# Patient Record
Sex: Female | Born: 1980 | Race: White | Hispanic: No | Marital: Married | Smoking: Former smoker
Health system: Southern US, Community
[De-identification: ages and names within clinical notes are randomized; demographics above are authoritative.]

## PROBLEM LIST (undated history)

## (undated) DIAGNOSIS — J45909 Unspecified asthma, uncomplicated: Secondary | ICD-10-CM

## (undated) DIAGNOSIS — K5090 Crohn's disease, unspecified, without complications: Secondary | ICD-10-CM

---

## 1999-01-27 ENCOUNTER — Ambulatory Visit (HOSPITAL_BASED_OUTPATIENT_CLINIC_OR_DEPARTMENT_OTHER): Admission: RE | Admit: 1999-01-27 | Discharge: 1999-01-27 | Payer: Self-pay | Admitting: *Deleted

## 2002-07-02 ENCOUNTER — Other Ambulatory Visit: Admission: RE | Admit: 2002-07-02 | Discharge: 2002-07-02 | Payer: Self-pay | Admitting: Obstetrics and Gynecology

## 2003-06-30 ENCOUNTER — Encounter: Admission: RE | Admit: 2003-06-30 | Discharge: 2003-06-30 | Payer: Self-pay | Admitting: Family Medicine

## 2003-07-22 ENCOUNTER — Other Ambulatory Visit: Admission: RE | Admit: 2003-07-22 | Discharge: 2003-07-22 | Payer: Self-pay | Admitting: Family Medicine

## 2004-05-26 ENCOUNTER — Encounter: Admission: RE | Admit: 2004-05-26 | Discharge: 2004-05-26 | Payer: Self-pay | Admitting: Family Medicine

## 2005-04-19 ENCOUNTER — Other Ambulatory Visit: Admission: RE | Admit: 2005-04-19 | Discharge: 2005-04-19 | Payer: Self-pay | Admitting: Obstetrics and Gynecology

## 2005-07-04 ENCOUNTER — Encounter: Admission: RE | Admit: 2005-07-04 | Discharge: 2005-07-04 | Payer: Self-pay | Admitting: Obstetrics and Gynecology

## 2007-03-04 ENCOUNTER — Emergency Department (HOSPITAL_COMMUNITY): Admission: EM | Admit: 2007-03-04 | Discharge: 2007-03-04 | Payer: Self-pay | Admitting: *Deleted

## 2007-06-06 ENCOUNTER — Inpatient Hospital Stay (HOSPITAL_COMMUNITY): Admission: EM | Admit: 2007-06-06 | Discharge: 2007-06-08 | Payer: Self-pay | Admitting: Emergency Medicine

## 2010-06-05 ENCOUNTER — Encounter: Payer: Self-pay | Admitting: Family Medicine

## 2010-09-28 NOTE — Discharge Summary (Signed)
NAME:  Sierra Brooks, Sierra Brooks            ACCOUNT NO.:  [id number]   MEDICAL RECORD NO.:  [id number]          PATIENT TYPE:  INP   LOCATION:  1333                         FACILITY:  [facility]   PHYSICIAN:  Corinna L. Lendell Caprice, MDDATE OF BIRTH:  [birth year]   DATE OF ADMISSION:  06/06/2007  DATE OF DISCHARGE:  06/08/2007                               DISCHARGE SUMMARY   DISCHARGE DIAGNOSES:  1. Acute bronchitis with asthma exacerbation.  Consider chronic      obstructive pulmonary disease.  2. Tobacco abuse, counseled against.  3. Anxiety and depression.   DISCHARGE MEDICATIONS:  1. Prednisone taper.  2. Azithromycin 250 mg p.o. daily for 2 more days.  3. Albuterol nebulizer has been arranged for home use as needed.  4. Continue Advair 250/50 one puff b.i.d.  5. Effexor XR 150 mg a day.  6. Xanax as needed as previous.  7. Nasacort as previous.  8. Ambien as needed as previous.  9. Tussionex 5 mL every 12 hours as needed for cough.   CONDITION:  Stable.   ACTIVITY:  Ad lib.   DIET:  Regular.   CONSULTATIONS:  None.   PROCEDURES:  None.   Follow up with Dr. Maia Plan al next week.   LABORATORY DATA:  White blood cell count was 12,000, otherwise normal  CBC.  On January 21, three liters of oxygen by nasal cannula revealed a  pH of 7.421, pCO2 30, pO2 57, bicarbonate 20, base deficit 3.5, oxygen  saturation 91%.  A D-dimer was 0.42.  Basic metabolic panel  unremarkable.   SPECIAL STUDIES/RADIOLOGY:  A chest x-ray on admission showed no focal  infiltrate, coarse central bronchial vascular markings similar to prior  and may represent reactive airway disease versus bronchitis.  Repeat  chest x-ray on January 21  showed new patchy areas of atelectasis but no  infiltrates, effusions or pneumothorax.   HISTORY AND HOSPITAL COURSE:  Sierra Brooks is a 30 year old white  female who presented to the emergency room with cough productive of  clear to white sputum.  She had subjective  fevers and chills.  She has a  history of asthma since childhood but is a smoker.  She denies a  diagnosis of COPD.  She had borrowed her friend's nebulizer machine,  felt no better, went to the [place] office, where she got nebulizers and  again felt no better and was therefore sent to the emergency room.  She  was treated with steroids and nebulizers but again failed to improve.  She had a pulse of 120,  otherwise normal vital signs.  She was coughing  but able to speak in full sentences.  She had good air movement.  Inspiratory and expiratory wheeze.  Chest x-ray showed no infiltrate.  She was placed on 23-hour observation and given azithromycin,  nebulizers, steroids; however, she worsened overnight and was changed to  an admission status.  Her steroids were accelerated.  By the time of  discharge she was feeling better.  She was able to ambulate the halls  with good oxygen saturations.  Her blood sugars remained fairly  unremarkable on steroids.  She was encouraged to quit smoking, and a  smoking cessation consult was ordered but as of yet is pending.      Corinna L. Lendell Caprice, MD  Electronically Signed     CLS/MEDQ  D:  06/08/2007  T:  06/08/2007  Job:  [id number]   cc:   Chales Salmon. Abigail Miyamoto, M.D.  Fax: [phone]

## 2010-09-28 NOTE — H&P (Signed)
NAME:  Sierra Brooks, Sierra Brooks            ACCOUNT NO.:  [id number]   MEDICAL RECORD NO.:  [id number]          PATIENT TYPE:  INP   LOCATION:  1333                         FACILITY:  [facility]   PHYSICIAN:  Corinna L. Lendell Caprice, MDDATE OF BIRTH:  1981/04/06   DATE OF ADMISSION:  06/06/2007  DATE OF DISCHARGE:                              HISTORY & PHYSICAL   CHIEF COMPLAINT:  Shortness of breath.   HISTORY OF PRESENT ILLNESS:  Ms. Sierra Brooks is a 30 year old white female  smoker with a history of asthma who presents with cough productive of  clear-white sputum.  She has had subjective fevers and chills, although  no documented fever.  She has been wheezing a lot.  She has a nebulizer  machine at home and continues to be short of breath.  She had several  nebulizer treatments at the [place] office and here in the emergency room.  She continues to feel short of breath and really feels no better.   PAST MEDICAL HISTORY:  1. Asthma.  Never been diagnosed with COPD, but she does smoke.  2. History of anxiety and depression.   MEDICATIONS:  1. Effexor XR 150 mg a day.  2. Xanax as needed.  3. Advair 250/50 1 puff b.i.d.  4. Xopenex as needed.  5. Nasacort.  6. Ambien as needed.   ALLERGIES:  PENICILLIN which causes tongue swelling.   SOCIAL HISTORY:  She smokes.  Denies drugs or heavy drinking.  She is a  Interior and spatial designer.   FOLLOW UP:  Noncontributory.   REVIEW OF SYSTEMS:  As above, otherwise negative.   PHYSICAL EXAMINATION:  VITAL SIGNS:  Temperature is 97.8, blood pressure  106/74, pulse 120, respiratory rate 20, oxygen saturation 94% on room  air.  GENERAL:  The patient is a white female who is coughing periodically,  able to speak in full sentences.  HEENT:  Normocephalic, atraumatic.  Pupils are equal, round, reactive to  light.  Sclerae nonicteric.  Moist mucous membranes.  Oropharynx is  without erythema or exudate.  NECK:  Supple.  No lymphadenopathy.  LUNGS:  She has good air  movement.  She has inspiratory and expiratory  wheezes, no rhonchi.  CARDIOVASCULAR:  Fast, regular, no murmurs, gallops or rubs.  ABDOMEN:  Soft, nontender, nondistended.  GU/RECTAL:  Deferred.  EXTREMITIES:  No clubbing, cyanosis or edema.  SKIN:  No rash.  PSYCHIATRIC:  Normal affect.   LABORATORY DATA:  White blood cell count is 12,000 with 70% neutrophils,  13% lymphocytes, 8% monocytes, 9% eosinophils, D-dimer 0.42.  Basic  metabolic panel unremarkable.   DIAGNOSTICS:  Chest x-ray shows no infiltrate, coarse central bronchial  vascular markings.   ASSESSMENT/PLAN:  1. Acute bronchitis with asthma exacerbation:  The patient will be      placed on 23-hour observation.  She will get oxygen nebulizers,      prednisone and azithromycin.  2. Anxiety:  Continue outpatient medications.  3. Tobacco abuse:  Counseled against and will get a smoking cessation      consult.      Corinna L. Lendell Caprice, MD  Electronically Signed     CLS/MEDQ  D:  06/07/2007  T:  06/07/2007  Job:  [id number]   cc:   Chales Salmon. Abigail Miyamoto, M.D.  Fax: [phone]

## 2011-02-04 LAB — CBC
MCHC: 34.1
MCV: 88.3
Platelets: 365
WBC: 12.8 — ABNORMAL HIGH

## 2011-02-04 LAB — BASIC METABOLIC PANEL
BUN: 5 — ABNORMAL LOW
Chloride: 108
Creatinine, Ser: 0.88

## 2011-02-04 LAB — BLOOD GAS, ARTERIAL
Acid-base deficit: 3.5 — ABNORMAL HIGH
Drawn by: 232811
O2 Content: 3
O2 Saturation: 90.8

## 2011-02-04 LAB — DIFFERENTIAL
Basophils Relative: 1
Eosinophils Absolute: 1.1 — ABNORMAL HIGH
Lymphs Abs: 1.7
Neutrophils Relative %: 70

## 2011-02-04 LAB — D-DIMER, QUANTITATIVE: D-Dimer, Quant: 0.42

## 2013-04-10 ENCOUNTER — Ambulatory Visit
Admission: RE | Admit: 2013-04-10 | Discharge: 2013-04-10 | Disposition: A | Discharge status: Home / Self Care | Payer: BC Managed Care – PPO | Source: Ambulatory Visit | Attending: Gastroenterology | Admitting: Gastroenterology

## 2013-04-10 ENCOUNTER — Other Ambulatory Visit: Payer: Self-pay | Admitting: Gastroenterology

## 2013-04-10 DIAGNOSIS — R1084 Generalized abdominal pain: Secondary | ICD-10-CM

## 2013-04-10 MED ORDER — IOHEXOL 300 MG/ML  SOLN: 100.0000 mL | Freq: Once | INTRAMUSCULAR | Status: DC | PRN

## 2013-04-10 MED ORDER — IOHEXOL 300 MG/ML  SOLN
30.0000 mL | Freq: Once | INTRAMUSCULAR | Status: AC | PRN
  Administered 2013-04-10: 30 mL via ORAL

## 2013-04-10 MED ORDER — IOHEXOL 300 MG/ML  SOLN
125.0000 mL | Freq: Once | INTRAMUSCULAR | Status: AC | PRN
  Administered 2013-04-10: 125 mL via INTRAVENOUS

## 2013-04-14 ENCOUNTER — Emergency Department (HOSPITAL_COMMUNITY)
Admission: EM | Admit: 2013-04-14 | Discharge: 2013-04-14 | Disposition: A | Discharge status: Home / Self Care | Payer: BC Managed Care – PPO | Attending: Emergency Medicine | Admitting: Emergency Medicine

## 2013-04-14 ENCOUNTER — Encounter (HOSPITAL_COMMUNITY): Payer: Self-pay | Admitting: Emergency Medicine

## 2013-04-14 DIAGNOSIS — R509 Fever, unspecified: Secondary | ICD-10-CM | POA: Insufficient documentation

## 2013-04-14 DIAGNOSIS — E876 Hypokalemia: Secondary | ICD-10-CM | POA: Insufficient documentation

## 2013-04-14 DIAGNOSIS — Z880 Allergy status to penicillin: Secondary | ICD-10-CM | POA: Insufficient documentation

## 2013-04-14 DIAGNOSIS — R109 Unspecified abdominal pain: Secondary | ICD-10-CM

## 2013-04-14 DIAGNOSIS — R1032 Left lower quadrant pain: Secondary | ICD-10-CM | POA: Insufficient documentation

## 2013-04-14 DIAGNOSIS — F17200 Nicotine dependence, unspecified, uncomplicated: Secondary | ICD-10-CM | POA: Insufficient documentation

## 2013-04-14 DIAGNOSIS — N83209 Unspecified ovarian cyst, unspecified side: Secondary | ICD-10-CM | POA: Insufficient documentation

## 2013-04-14 DIAGNOSIS — J45909 Unspecified asthma, uncomplicated: Secondary | ICD-10-CM | POA: Insufficient documentation

## 2013-04-14 DIAGNOSIS — Z3202 Encounter for pregnancy test, result negative: Secondary | ICD-10-CM | POA: Insufficient documentation

## 2013-04-14 DIAGNOSIS — K5090 Crohn's disease, unspecified, without complications: Secondary | ICD-10-CM | POA: Insufficient documentation

## 2013-04-14 DIAGNOSIS — R112 Nausea with vomiting, unspecified: Secondary | ICD-10-CM | POA: Insufficient documentation

## 2013-04-14 DIAGNOSIS — Z79899 Other long term (current) drug therapy: Secondary | ICD-10-CM | POA: Insufficient documentation

## 2013-04-14 HISTORY — DX: Crohn's disease, unspecified, without complications: K50.90

## 2013-04-14 HISTORY — DX: Unspecified asthma, uncomplicated: J45.909

## 2013-04-14 LAB — WET PREP, GENITAL
Clue Cells Wet Prep HPF POC: NONE SEEN
Trich, Wet Prep: NONE SEEN
Yeast Wet Prep HPF POC: NONE SEEN

## 2013-04-14 LAB — URINALYSIS, ROUTINE W REFLEX MICROSCOPIC
Glucose, UA: NEGATIVE mg/dL
Ketones, ur: 40 mg/dL — AB
Specific Gravity, Urine: 1.019 (ref 1.005–1.030)
pH: 6 (ref 5.0–8.0)

## 2013-04-14 LAB — COMPREHENSIVE METABOLIC PANEL
Albumin: 3.2 g/dL — ABNORMAL LOW (ref 3.5–5.2)
Alkaline Phosphatase: 90 U/L (ref 39–117)
BUN: 6 mg/dL (ref 6–23)
Calcium: 9.3 mg/dL (ref 8.4–10.5)
Potassium: 2.9 mEq/L — ABNORMAL LOW (ref 3.5–5.1)
Sodium: 132 mEq/L — ABNORMAL LOW (ref 135–145)
Total Protein: 7 g/dL (ref 6.0–8.3)

## 2013-04-14 LAB — CBC WITH DIFFERENTIAL/PLATELET
Basophils Relative: 0 % (ref 0–1)
Eosinophils Absolute: 0.2 10*3/uL (ref 0.0–0.7)
MCH: 29.3 pg (ref 26.0–34.0)
MCHC: 34.7 g/dL (ref 30.0–36.0)
Monocytes Relative: 10 % (ref 3–12)
Neutrophils Relative %: 79 % — ABNORMAL HIGH (ref 43–77)
Platelets: 364 10*3/uL (ref 150–400)

## 2013-04-14 LAB — URINE MICROSCOPIC-ADD ON

## 2013-04-14 LAB — POCT PREGNANCY, URINE: Preg Test, Ur: NEGATIVE

## 2013-04-14 MED ORDER — ONDANSETRON 4 MG PO TBDP
8.0000 mg | ORAL_TABLET | Freq: Once | ORAL | Status: AC
  Administered 2013-04-14: 8 mg via ORAL
  Filled 2013-04-14: qty 2

## 2013-04-14 MED ORDER — ONDANSETRON HCL 4 MG/2ML IJ SOLN
4.0000 mg | Freq: Once | INTRAMUSCULAR | Status: AC
  Administered 2013-04-14: 4 mg via INTRAVENOUS
  Filled 2013-04-14: qty 2

## 2013-04-14 MED ORDER — HYDROMORPHONE HCL PF 1 MG/ML IJ SOLN
1.0000 mg | Freq: Once | INTRAMUSCULAR | Status: AC
  Administered 2013-04-14: 1 mg via INTRAVENOUS
  Filled 2013-04-14: qty 1

## 2013-04-14 MED ORDER — HYDROCODONE-ACETAMINOPHEN 5-325 MG PO TABS: 2.0000 | ORAL_TABLET | ORAL | Status: AC | PRN

## 2013-04-14 MED ORDER — ONDANSETRON HCL 4 MG PO TABS: 4.0000 mg | ORAL_TABLET | Freq: Four times a day (QID) | ORAL | Status: AC

## 2013-04-14 MED ORDER — POTASSIUM CHLORIDE CRYS ER 20 MEQ PO TBCR
40.0000 meq | EXTENDED_RELEASE_TABLET | Freq: Once | ORAL | Status: AC
  Administered 2013-04-14: 40 meq via ORAL
  Filled 2013-04-14: qty 2

## 2013-04-14 NOTE — ED Notes (Signed)
Pt states she had a CT abdomen on Wed that showed she had a ruptured L ovarian cyst.  Pt has been vomiting since then.  Initially her vomiting stopped when she started taking oxycodone, but as the pain became greater, she began vomiting again (pain was not relieved by tramadol).  Pt states she also began spotting today and she is not due for her period for 1.5 weeks.  She was told by her GI doc to follow up with her OBGYN, but they are not open this weekend.

## 2013-04-14 NOTE — ED Provider Notes (Signed)
CSN: [id number]     Arrival date & time 04/14/13  1556 History   First MD Initiated Contact with Patient 04/14/13 1857     Chief Complaint  Patient presents with  . Abdominal Pain  . Emesis   (Consider location/radiation/quality/duration/timing/severity/associated sxs/prior Treatment) HPI  32 year old female history of Crohn's presents for evaluation of abdominal pain. Patient reports gradual onset of constant sharp crampy pain to the left side abdomen ongoing for about a week. Pain has gotten progressively worse, radiating across her abdomen. Pain is worsened with movement and improves when she lays flat. Pain is associated with nausea and vomiting. She vomits 2 or 3 times per day. Vomitus is nonbloody nonbilious. She has loose stools this for the past several months and was recently diagnosed with Crohn's disease 6 months ago. She does endorse subjective fever, and persistent nausea. Denies blood in the stool. She has been evaluated for her symptom 3 days ago. Abdominal CT scan shows evidence of a rupture ovarian cyst but no evidence of appendicitis or diverticulitis. No obvious Crohn's flare on CT scan. Patient was prescribed trauma at all. She tries taking the medication but it did not help. She also tries taking her home medication, Percocet with minimal relief however today it did not help. She also report having mild vaginal spotting this a.m. States this is early for her menstruation. Report mild vaginal discharge without any vaginal discomfort.   Past Medical History  Diagnosis Date  . Crohn's disease   . Asthma    History reviewed. No pertinent past surgical history. No family history on file. History  Substance Use Topics  . Smoking status: Current Every Day Smoker -- 0.50 packs/day    Types: Cigarettes  . Smokeless tobacco: Not on file  . Alcohol Use: No   OB History   Grav Para Term Preterm Abortions TAB SAB Ect Mult Living                 Review of Systems  All other  systems reviewed and are negative.    Allergies  Gluten meal and Penicillins  Home Medications   Current Outpatient Rx  Name  Route  Sig  Dispense  Refill  . albuterol (PROVENTIL HFA;VENTOLIN HFA) 108 (90 BASE) MCG/ACT inhaler   Inhalation   Inhale 1 puff into the lungs every 6 (six) hours as needed for wheezing or shortness of breath.         . azaTHIOprine (IMURAN) 50 MG tablet   Oral   Take 50 mg by mouth daily.         . budesonide-formoterol (SYMBICORT) 160-4.5 MCG/ACT inhaler   Inhalation   Inhale 1 puff into the lungs as needed (for wheezing and cough).         . esomeprazole (NEXIUM) 40 MG capsule   Oral   Take 40 mg by mouth daily.          Marland Kitchen oxyCODONE-acetaminophen (PERCOCET/ROXICET) 5-325 MG per tablet   Oral   Take 2 tablets by mouth every 8 (eight) hours as needed for severe pain.          BP 116/64  Pulse 95  Temp(Src) 100 F (37.8 C) (Oral)  Resp 16  Wt 213 lb 14.4 oz (97.024 kg)  SpO2 98%  LMP 03/27/2013 Physical Exam  Nursing note and vitals reviewed. Constitutional: She appears well-developed and well-nourished. No distress.  HENT:  Head: Normocephalic and atraumatic.  Eyes: Conjunctivae are normal.  Neck: Normal range of motion. Neck  supple.  Cardiovascular: Normal rate and regular rhythm.   Pulmonary/Chest: Effort normal and breath sounds normal. She exhibits no tenderness.  Abdominal: Soft. There is tenderness (left lower quadrant tenderness on palpation without guarding or rebound tenderness.). There is no rebound and no guarding.  Genitourinary: Uterus normal. There is no rash or lesion on the right labia. There is no rash or lesion on the left labia. Cervix exhibits no motion tenderness and no discharge. Right adnexum displays no mass and no tenderness. Left adnexum displays no mass and no tenderness. There is bleeding around the vagina. No erythema or tenderness around the vagina. No vaginal discharge found.  Chaperone  present   Dark blood noted in vaginal vault.  Cervical os is closed.    Lymphadenopathy:       Right: No inguinal adenopathy present.       Left: No inguinal adenopathy present.    ED Course  Procedures (including critical care time)  9:28 PM Hx of Crohns, and ruptured ovarian cyst.  Pelvic exam today without evidence suggestive of PID.  Recent CT without acute finding.  Preg negative, wet prep result unremarkable.  UA without evidence of UTI.  Pt has elevated WBC 15.7, could be due to recent steroid use.  K+ 2.9, supplementation given.  No active chest pain or SOB concerning for ACS or PE.  sxs could also be viral in etiology.  9:35 PM Pain is well controlled.  I recommend continue with pain management at home.  Follow up with PCP.  Take antinausea medication as needed.  Return if sxs worsen.  Pt able to tolerates PO at this time.    Labs Review Labs Reviewed  CBC WITH DIFFERENTIAL - Abnormal; Notable for the following:    WBC 15.7 (*)    Neutrophils Relative % 79 (*)    Neutro Abs 12.4 (*)    Lymphocytes Relative 10 (*)    Monocytes Absolute 1.6 (*)    All other components within normal limits  COMPREHENSIVE METABOLIC PANEL - Abnormal; Notable for the following:    Sodium 132 (*)    Potassium 2.9 (*)    Chloride 94 (*)    Albumin 3.2 (*)    All other components within normal limits  URINALYSIS, ROUTINE W REFLEX MICROSCOPIC - Abnormal; Notable for the following:    Bilirubin Urine SMALL (*)    Ketones, ur 40 (*)    Leukocytes, UA TRACE (*)    All other components within normal limits  URINE MICROSCOPIC-ADD ON  POCT PREGNANCY, URINE   Imaging Review No results found.  Show images for CT Abdomen Pelvis W Contrast         Study Result    CLINICAL DATA: Diffuse left-sided abdominal pain for 3 days, recent  diagnosis of Crohn's disease and gluten allergy  EXAM:  CT ABDOMEN AND PELVIS WITH CONTRAST  TECHNIQUE:  Multidetector CT imaging of the abdomen and pelvis was  performed  using the standard protocol following bolus administration of  intravenous contrast.  CONTRAST: 30mL OMNIPAQUE IOHEXOL 300 MG/ML SOLN, OMNIPAQUE  IOHEXOL 300 MG/ML SOLN  COMPARISON: None.  FINDINGS:  The lung bases are clear. The liver enhances with no focal  abnormality and no ductal dilatation is seen. No calcified  gallstones are noted. The pancreas is normal in size and the  pancreatic duct is not dilated. The adrenal glands and spleen are  unremarkable. The stomach is not well distended. The kidneys enhance  with no calculus or mass  and no hydronephrosis is seen. The  abdominal aorta is normal in caliber. No adenopathy is seen.  There are several slightly prominent nodes within the right lower  quadrant, with the largest of 8 mm in short axis diameter. The  terminal ileum is moderately well visualized and there is only  minimal prominence of the mucosa of the terminal ileum. No definite  evidence of Crohn's disease is identified by CT. These slightly  prominent lymph nodes in the right lower quadrant could represent  mesenteric adenitis. The appendix is not well seen but no  abnormality is evident.  The urinary bladder is decompressed. The uterus is normal in size. A  probable collapsing cyst is noted in the left adnexa. No free fluid  is noted within the pelvis. The lumbar vertebrae are in normal  alignment.  IMPRESSION:  1. No definite evidence of active Crohn's disease is seen. There may  be very minimal prominence of the mucosa of the terminal ileum.  2. Several slightly prominent lymph nodes are present within the  right lower quadrant. This is nonspecific and can be seen with  Crohn's disease, but also could indicate mesenteric adenitis.  3. Collapsing left ovarian cyst.  Electronically Signed  By: Dwyane Dee M.D.  On: 04/10/2013 12:17     EKG Interpretation   None       MDM   1. Abdominal pain   2. Ovarian cyst    BP 101/72  Pulse 99   Temp(Src) 100 F (37.8 C) (Oral)  Resp 16  Wt 213 lb 14.4 oz (97.024 kg)  SpO2 93%  LMP 03/27/2013  I have reviewed nursing notes and vital signs. I personally reviewed the imaging tests through PACS system  I reviewed available ER/hospitalization records thought the EMR    Sierra Brooks, [place] 04/14/13 2140

## 2013-04-15 LAB — GC/CHLAMYDIA PROBE AMP: GC Probe RNA: NEGATIVE

## 2013-04-15 NOTE — ED Provider Notes (Signed)
Medical screening examination/treatment/procedure(s) were performed by non-physician practitioner and as supervising physician I was immediately available for consultation/collaboration.  EKG Interpretation   None        Jakirah Zaun R. Webster Patrone, MD 04/15/13 0001 

## 2014-04-29 IMAGING — CT CT ABD-PELV W/ CM
2 of 4 series · 16 of 46 positions shown, 18 images · IV contrast (omnipaque)
Comparison: None.

CLINICAL DATA: Diffuse left-sided abdominal pain for 3 days, recent
diagnosis of Crohn's disease and gluten allergy

EXAM:
CT ABDOMEN AND PELVIS WITH CONTRAST
TECHNIQUE: Multidetector CT imaging of the abdomen and pelvis was performed
using the standard protocol following bolus administration of
intravenous contrast.
CONTRAST:  30mL OMNIPAQUE IOHEXOL 300 MG/ML SOLN, 125mL OMNIPAQUE
IOHEXOL 300 MG/ML SOLN

[Series 2: abd/pelvis with · axial · 0.96mm/px · z∈[-436,+4]mm · 13 of 96 slices shown, 15 images]
[im 4/96  soft-tissue]
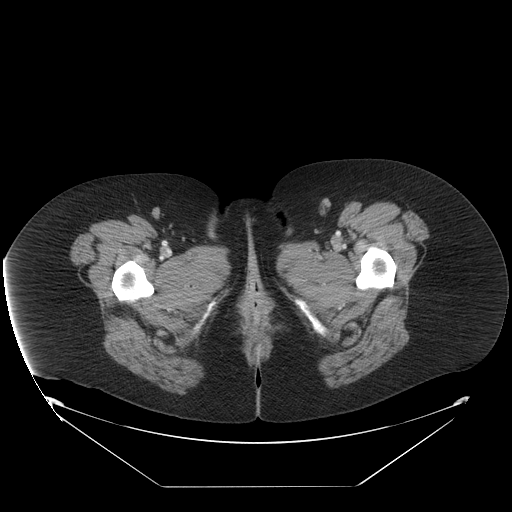
[im 4/96  bone]
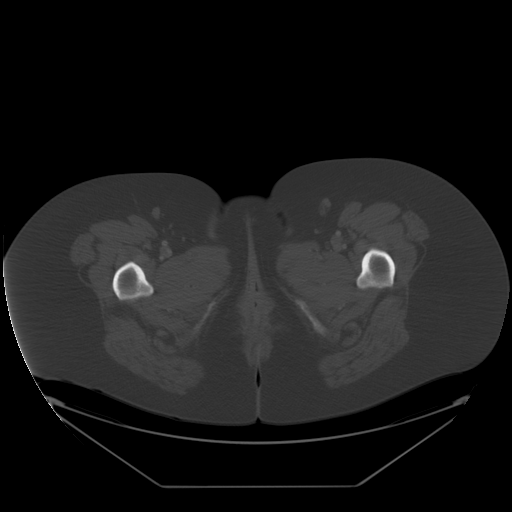
[im 12/96  soft-tissue]
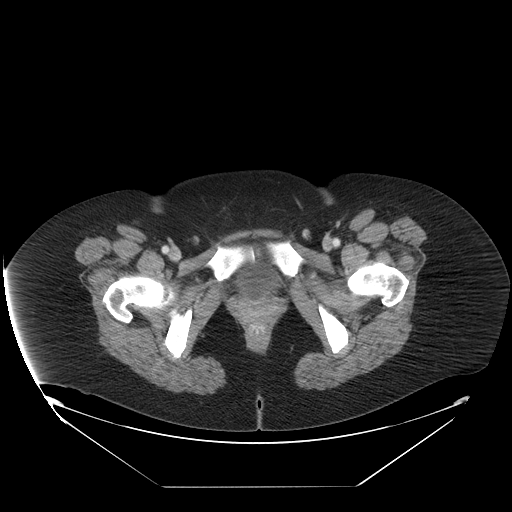
[im 20/96  soft-tissue]
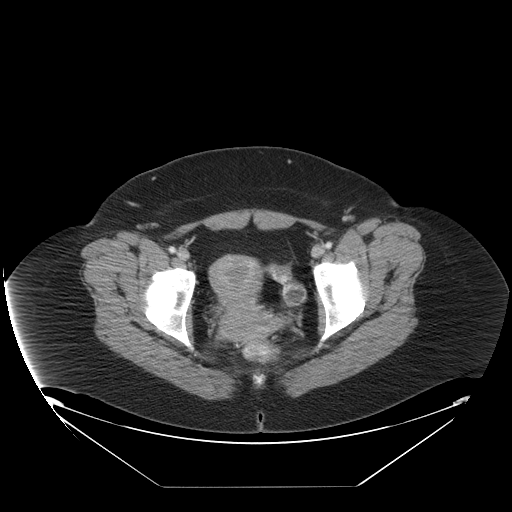
[im 27/96  soft-tissue]
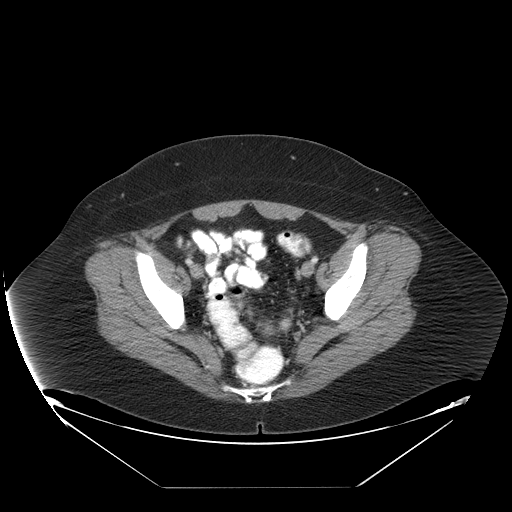
[im 35/96  soft-tissue]
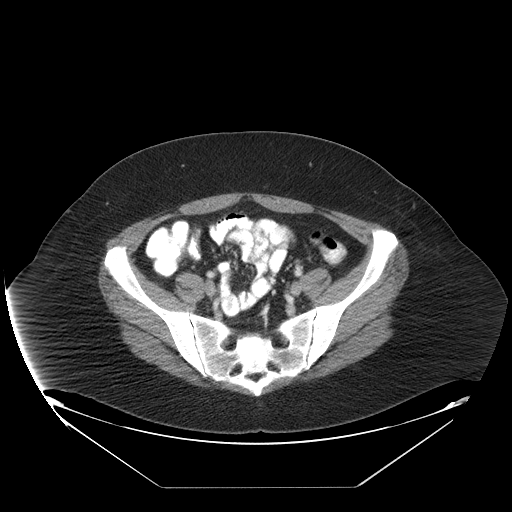
[im 42/96  soft-tissue]
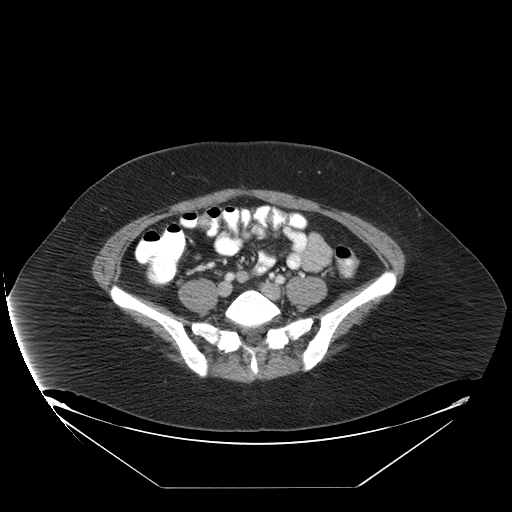
[im 50/96  soft-tissue]
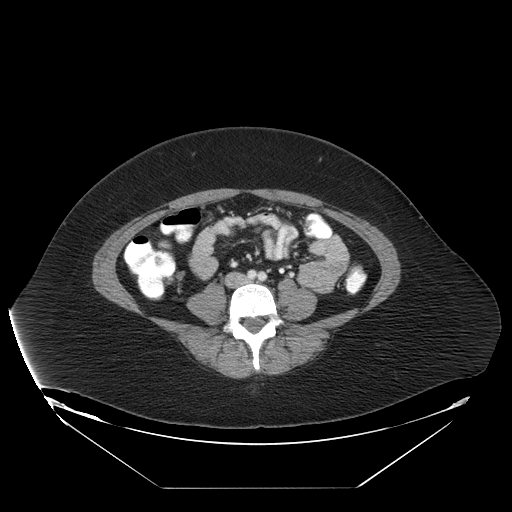
[im 54/96  soft-tissue]
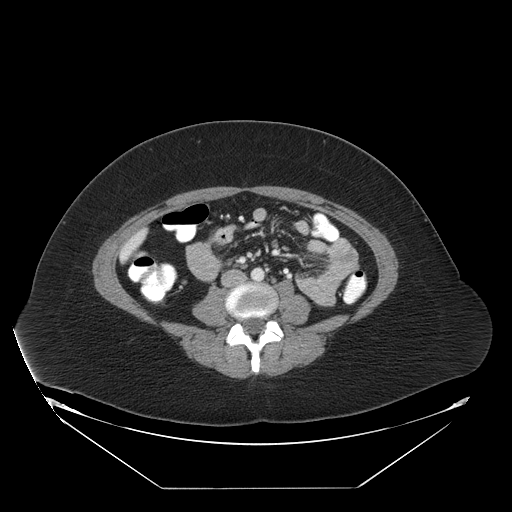
[im 61/96  soft-tissue]
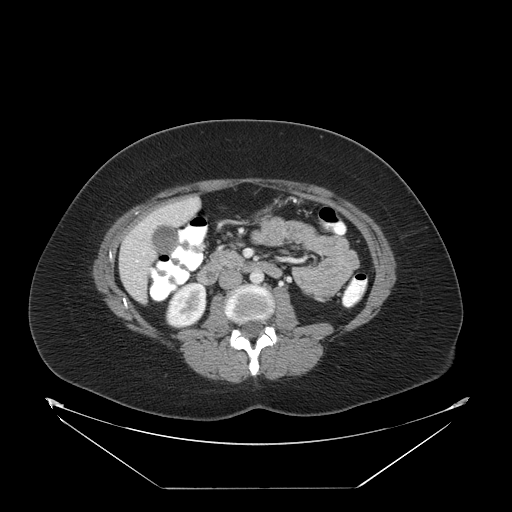
[im 61/96  bone]
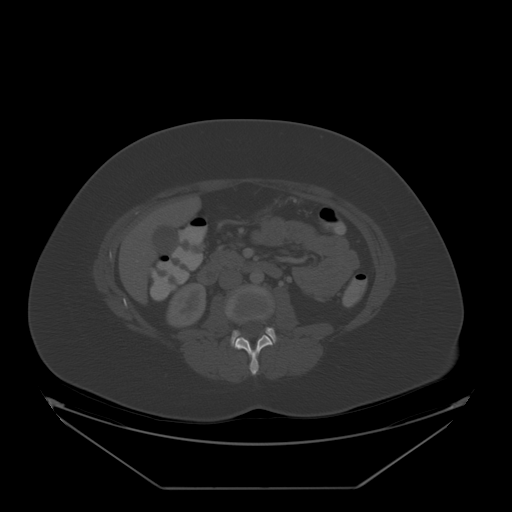
[im 69/96  soft-tissue]
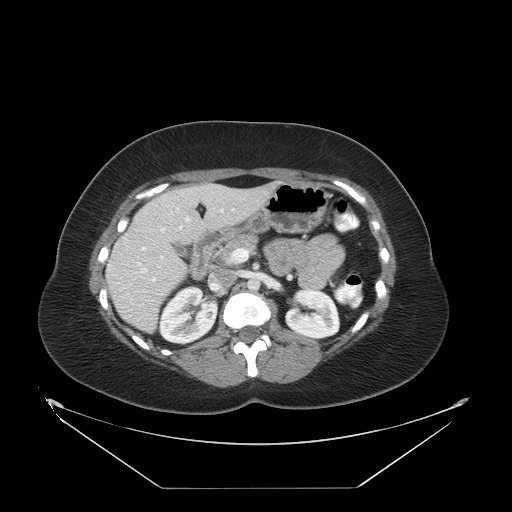
[im 77/96  soft-tissue]
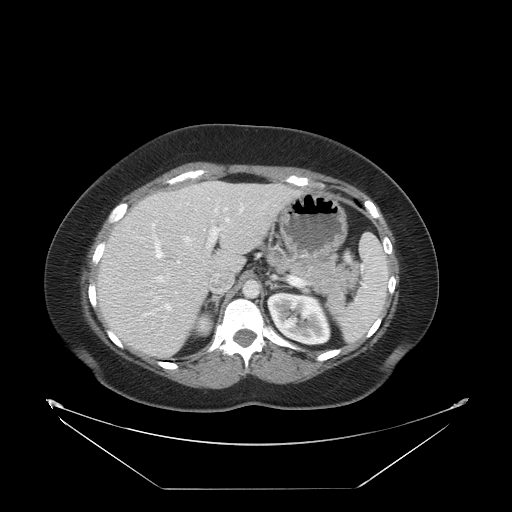
[im 84/96  soft-tissue]
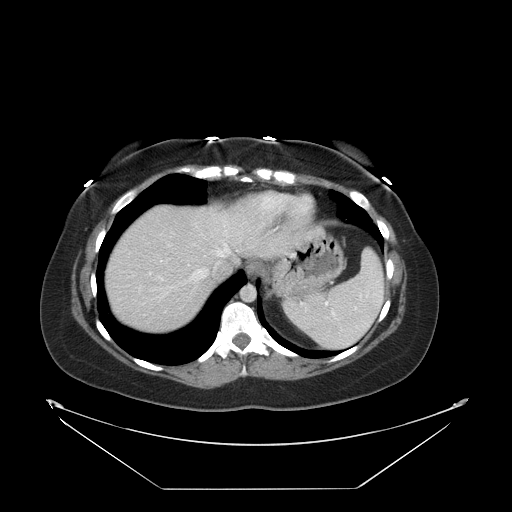
[im 92/96  soft-tissue]
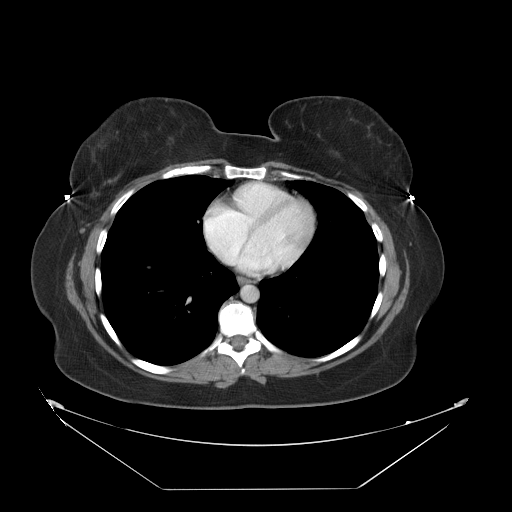

[Series 401: coronal · coronal · 0.96mm/px · 3 of 125 slices shown]
[im 42/125  soft-tissue]
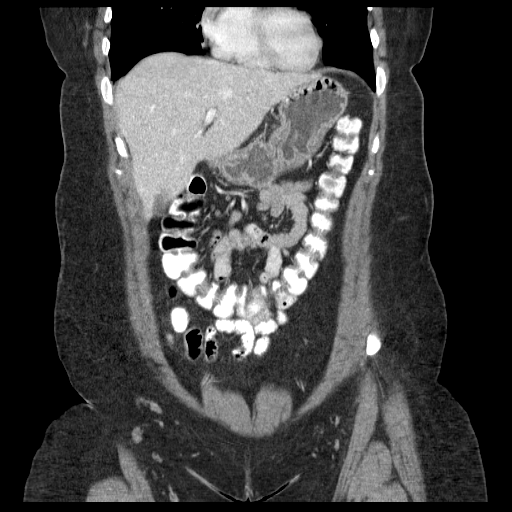
[im 56/125  soft-tissue]
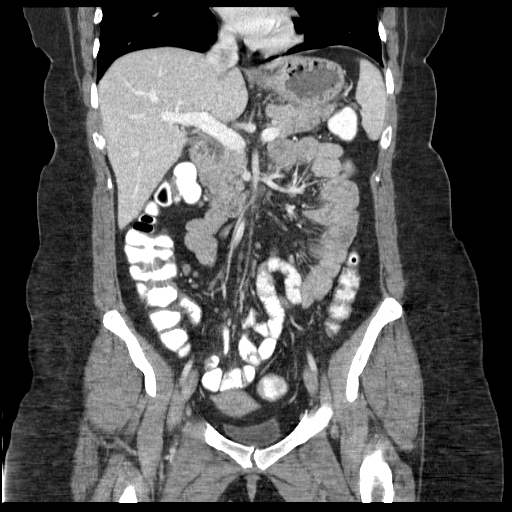
[im 69/125  soft-tissue]
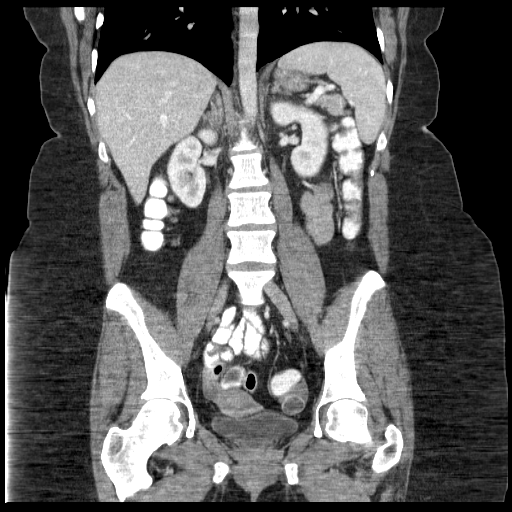

[16 of 46 positions shown; findings below may reference images not displayed]

FINDINGS: The lung bases are clear. The liver enhances with no focal
abnormality and no ductal dilatation is seen. No calcified
gallstones are noted. The pancreas is normal in size and the
pancreatic duct is not dilated. The adrenal glands and spleen are
unremarkable. The stomach is not well distended. The kidneys enhance
with no calculus or mass and no hydronephrosis is seen. The
abdominal aorta is normal in caliber. No adenopathy is seen.

There are several slightly prominent nodes within the right lower
quadrant, with the largest of 8 mm in short axis diameter. The
terminal ileum is moderately well visualized and there is only
minimal prominence of the mucosa of the terminal ileum. No definite
evidence of Crohn's disease is identified by CT. These slightly
prominent lymph nodes in the right lower quadrant could represent
mesenteric adenitis. The appendix is not well seen but no
abnormality is evident.

The urinary bladder is decompressed. The uterus is normal in size. A
probable collapsing cyst is noted in the left adnexa. No free fluid
is noted within the pelvis. The lumbar vertebrae are in normal
alignment.
IMPRESSION: 1. No definite evidence of active Crohn's disease is seen. There may
be very minimal prominence of the mucosa of the terminal ileum.
2. Several slightly prominent lymph nodes are present within the
right lower quadrant. This is nonspecific and can be seen with
Crohn's disease, but also could indicate mesenteric adenitis.
3. Collapsing left ovarian cyst.

## 2016-11-07 ENCOUNTER — Encounter: Payer: Self-pay | Admitting: Vascular Surgery

## 2016-11-07 ENCOUNTER — Ambulatory Visit (INDEPENDENT_AMBULATORY_CARE_PROVIDER_SITE_OTHER): Payer: BLUE CROSS/BLUE SHIELD | Admitting: Vascular Surgery

## 2016-11-07 VITALS — BP 110/72 | HR 90 | Temp 98.2°F | Resp 14 | Ht 65.0 in | Wt 216.0 lb

## 2016-11-07 DIAGNOSIS — I83893 Varicose veins of bilateral lower extremities with other complications: Secondary | ICD-10-CM | POA: Diagnosis not present

## 2016-11-07 NOTE — Progress Notes (Signed)
Subjective:     Patient ID: Sierra Brooks, female   DOB: 1980-10-28, 36 y.o.   MRN: [id number]  HPI This 36 year old female was referred by Dr. 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 Patient has had prominent veins in the medial left thigh and lateral left thigh for many years but these have become more prominent. 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 She has no history of DVT thrombophlebitis stasis ulcers or bleeding. She has had some bruising beneath the skin on the medial thigh varicosities. She denies any pain related to the lateral thigh but does occasionally have discomfort towards the end of the day in the medial thigh area. She does not elastic compression stockings.  Past Medical History:  Diagnosis Date  . Asthma   . Crohn's disease [facility](HCC)     Social History  Substance Use Topics  . Smoking status: Former Smoker    Packs/day: 0.50    Types: Cigarettes    Quit date: 07/10/2016  . Smokeless tobacco: Never Used  . Alcohol use No    No family history on file.  Allergies  Allergen Reactions  . Gluten Meal   . Penicillins      Current Outpatient Prescriptions:  .  azaTHIOprine (IMURAN) 50 MG tablet, Take 50 mg by mouth daily., Disp: , Rfl:  .  buPROPion (WELLBUTRIN SR) 150 MG 12 hr tablet, Take 150 mg by mouth 2 (two) times daily., Disp: , Rfl: 12 .  albuterol (PROVENTIL HFA;VENTOLIN HFA) 108 (90 BASE) MCG/ACT inhaler, Inhale 1 puff into the lungs every 6 (six) hours as needed for wheezing or shortness of breath., Disp: , Rfl:  .  budesonide-formoterol (SYMBICORT) 160-4.5 MCG/ACT inhaler, Inhale 1 puff into the lungs as needed (for wheezing and cough)., Disp: , Rfl:  .  esomeprazole (NEXIUM) 40 MG capsule, Take 40 mg by mouth daily. , Disp: , Rfl:  .  HYDROcodone-acetaminophen (NORCO/VICODIN) 5-325 MG per tablet, Take 2 tablets by mouth every 4 (four) hours as needed for severe pain. (Patient not taking: Reported on 11/07/2016),  Disp: 12 tablet, Rfl: 0 .  ondansetron (ZOFRAN) 4 MG tablet, Take 1 tablet (4 mg total) by mouth every 6 (six) hours. (Patient not taking: Reported on 11/07/2016), Disp: 12 tablet, Rfl: 0 .  oxyCODONE-acetaminophen (PERCOCET/ROXICET) 5-325 MG per tablet, Take 2 tablets by mouth every 8 (eight) hours as needed for severe pain., Disp: , Rfl:   Vitals:   11/07/16 1522  BP: 110/72  Pulse: 90  Resp: 14  Temp: 98.2 F (36.8 C)  TempSrc: Oral  SpO2: 97%  Weight: 216 lb (98 kg)  Height: 5\' 5"  (1.651 m)    Body mass index is 35.94 kg/m.         Review of Systems Denies chest pain, dyspnea on exertion, PND, orthopnea, hemoptysis. Has history of Crohn's disease. Quit smoking 6 months ago after smoking for 10 years.    Objective:   Physical Exam BP 110/72 (BP Location: Left Arm, Patient Position: Sitting, Cuff Size: Large)   Pulse 90   Temp 98.2 F (36.8 C) (Oral)   Resp 14   Ht 5\' 5"  (1.651 m)   Wt 216 lb (98 kg)   SpO2 97%   BMI 35.94 kg/m     Gen.-alert and oriented x3 in no apparent distress HEENT normal for age Lungs no rhonchi or wheezing Cardiovascular regular rhythm no murmurs carotid pulses 3+ palpable no bruits audible Abdomen soft nontender  no palpable masses Musculoskeletal free of  major deformities Skin clear -no rashes Neurologic normal Lower extremities 3+ femoral and dorsalis pedis pulses palpable bilaterally with no edema 'Left leg has small-sized bulging varicosities in the medial aspect of the distal thigh and the patch of reticular veins and small varicose veins in the lateral distal thigh. No hyperpigmentation or ulceration noted distally. Otherwise no bulging varicosities noted. No spider veins seen.  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 She is leaning toward no treatment but does have an interest in foam sclerotherapy. She will consider this and if she would like to further discuss this she will be in touch with Clementeen Hoof.

## 2017-02-06 ENCOUNTER — Encounter: Payer: Self-pay | Admitting: Vascular Surgery

## 2017-04-05 ENCOUNTER — Encounter: Payer: Self-pay | Admitting: Family Medicine

## 2021-12-29 DIAGNOSIS — M2011 Hallux valgus (acquired), right foot: Secondary | ICD-10-CM | POA: Diagnosis not present

## 2021-12-29 DIAGNOSIS — R601 Generalized edema: Secondary | ICD-10-CM | POA: Diagnosis not present

## 2021-12-29 DIAGNOSIS — L03116 Cellulitis of left lower limb: Secondary | ICD-10-CM | POA: Diagnosis not present

## 2021-12-29 DIAGNOSIS — R262 Difficulty in walking, not elsewhere classified: Secondary | ICD-10-CM | POA: Diagnosis not present

## 2021-12-29 DIAGNOSIS — M2012 Hallux valgus (acquired), left foot: Secondary | ICD-10-CM | POA: Diagnosis not present

## 2022-01-10 DIAGNOSIS — R262 Difficulty in walking, not elsewhere classified: Secondary | ICD-10-CM | POA: Diagnosis not present

## 2022-01-10 DIAGNOSIS — R601 Generalized edema: Secondary | ICD-10-CM | POA: Diagnosis not present

## 2022-01-10 DIAGNOSIS — L851 Acquired keratosis [keratoderma] palmaris et plantaris: Secondary | ICD-10-CM | POA: Diagnosis not present

## 2022-01-10 DIAGNOSIS — M2011 Hallux valgus (acquired), right foot: Secondary | ICD-10-CM | POA: Diagnosis not present

## 2022-01-10 DIAGNOSIS — L03116 Cellulitis of left lower limb: Secondary | ICD-10-CM | POA: Diagnosis not present

## 2022-01-10 DIAGNOSIS — M2012 Hallux valgus (acquired), left foot: Secondary | ICD-10-CM | POA: Diagnosis not present

## 2022-10-18 DIAGNOSIS — M79641 Pain in right hand: Secondary | ICD-10-CM | POA: Diagnosis not present

## 2023-08-21 ENCOUNTER — Other Ambulatory Visit: Payer: Self-pay | Admitting: Family Medicine

## 2023-08-21 DIAGNOSIS — Z1231 Encounter for screening mammogram for malignant neoplasm of breast: Secondary | ICD-10-CM
